# Patient Record
Sex: Male | Born: 1975
Health system: Southern US, Academic
[De-identification: ages and names within clinical notes are randomized; demographics above are authoritative.]

---

## 2015-10-14 ENCOUNTER — Emergency Department
Admission: EM | Admit: 2015-10-14 | Discharge: 2015-10-14 | Disposition: A | Discharge status: Home / Self Care | Payer: Self-pay | Attending: Student | Admitting: Student

## 2015-10-14 ENCOUNTER — Encounter: Payer: Self-pay | Admitting: Emergency Medicine

## 2015-10-14 ENCOUNTER — Emergency Department: Payer: Self-pay

## 2015-10-14 DIAGNOSIS — J40 Bronchitis, not specified as acute or chronic: Secondary | ICD-10-CM

## 2015-10-14 DIAGNOSIS — J209 Acute bronchitis, unspecified: Secondary | ICD-10-CM | POA: Insufficient documentation

## 2015-10-14 DIAGNOSIS — Z720 Tobacco use: Secondary | ICD-10-CM | POA: Insufficient documentation

## 2015-10-14 LAB — POCT RAPID STREP A: STREPTOCOCCUS, GROUP A SCREEN (DIRECT): NEGATIVE

## 2015-10-14 MED ORDER — AZITHROMYCIN 250 MG PO TABS
500.0000 mg | ORAL_TABLET | Freq: Once | ORAL | Status: AC
  Administered 2015-10-14: 500 mg via ORAL
  Filled 2015-10-14: qty 2

## 2015-10-14 MED ORDER — PREDNISONE 20 MG PO TABS
60.0000 mg | ORAL_TABLET | Freq: Every day | ORAL | Status: DC
  Administered 2015-10-14: 60 mg via ORAL
  Filled 2015-10-14: qty 3

## 2015-10-14 MED ORDER — BENZONATATE 100 MG PO CAPS
100.0000 mg | ORAL_CAPSULE | Freq: Once | ORAL | Status: AC
  Administered 2015-10-14: 100 mg via ORAL
  Filled 2015-10-14: qty 1

## 2015-10-14 MED ORDER — AZITHROMYCIN 250 MG PO TABS: ORAL_TABLET | ORAL | Status: AC

## 2015-10-14 MED ORDER — IBUPROFEN 600 MG PO TABS
600.0000 mg | ORAL_TABLET | Freq: Once | ORAL | Status: AC
  Administered 2015-10-14: 600 mg via ORAL
  Filled 2015-10-14: qty 1

## 2015-10-14 MED ORDER — IPRATROPIUM-ALBUTEROL 0.5-2.5 (3) MG/3ML IN SOLN
3.0000 mL | Freq: Once | RESPIRATORY_TRACT | Status: AC
  Administered 2015-10-14: 3 mL via RESPIRATORY_TRACT
  Filled 2015-10-14: qty 3

## 2015-10-14 NOTE — ED Notes (Signed)
Patient ambulatory to triage with steady gait, without difficulty or distress noted; pt reports prod cough green sputum since last week with no fever

## 2015-10-14 NOTE — ED Provider Notes (Signed)
-----------------------------------------   8:15 AM on 10/14/2015 -----------------------------------------  Care was assumed from Dr. Zenda AlpersWebster at 7:30 pending results of chest x-ray and strep test which are both negative. Tachycardia has resolved at this time. Patient with symptomatic improvement. We'll discharge with azithromycin and Dr. Leone PayorWebster's discharge paperwork. I discussed return precautions and need for close follow-up with the patient he is comfortable with the discharge plan.  Gayla DossEryka A Tasfia Vasseur, MD 10/14/15 [phone]

## 2015-10-14 NOTE — ED Provider Notes (Signed)
[facility] Emergency Department Provider Note  ____________________________________________  Time seen: Approximately 645 AM  I have reviewed the triage vital signs and the nursing notes.   HISTORY  Chief Complaint Cough    HPI Joshua Finley is a 39 y.o. male who comes into the hospital today complaining of bronchitis. The patient reports that he has had a cough since Thursday and every time he gets a cold he gets bronchitis. The patient reports that he is unable to sleep and he came in tonight because he coughed up some blood. The patient reports that he has some green and yellow mucus. He denies any fevers but does have some chest pain with coughing. The patient reports that the symptoms started with a sore throat and then turned into a cold. He reports that in the past at either been pneumonia or bronchitis. The patient has some mild shortness of breath and was sweaty yesterday but he denies any nausea vomiting or abdominal pain. The patient reports he was here just to see what was going on and see if he can have some antibiotics for his symptoms.The patient reports his pain as a 9 out of 10 in intensity.   Past medical history Bronchitis IBS There are no active problems to display for this patient.   History reviewed. No pertinent past surgical history.  No current outpatient prescriptions on file.  Allergies Review of patient's allergies indicates no known allergies.  No family history on file.  Social History Social History  Substance Use Topics  . Smoking status: Current Every Day Smoker -- 0.50 packs/day    Types: Cigarettes  . Smokeless tobacco: None  . Alcohol Use: No    Review of Systems Constitutional: No fever/chills Eyes: No visual changes. ENT:  sore throat. Cardiovascular:  chest pain with cough Respiratory:  shortness of breath and cough, hemoptysis Gastrointestinal: No abdominal pain.  No nausea, no vomiting.  No diarrhea.   No constipation. Genitourinary: Negative for dysuria. Musculoskeletal: Negative for back pain. Skin: Negative for rash. Neurological: Negative for headaches, focal weakness or numbness.  10-point ROS otherwise negative.  ____________________________________________   PHYSICAL EXAM:  VITAL SIGNS: ED Triage Vitals  Enc Vitals Group     BP 10/14/15 0637 139/67 mmHg     Pulse Rate 10/14/15 0637 110     Resp 10/14/15 0637 20     Temp 10/14/15 0637 98 F (36.7 C)     Temp Source 10/14/15 0637 Oral     SpO2 10/14/15 0637 100 %     Weight 10/14/15 0637 230 lb (104.327 kg)     Height 10/14/15 0637 6\' 1"  (1.854 m)     Head Cir --      Peak Flow --      Pain Score 10/14/15 0710 9     Pain Loc --      Pain Edu? --      Excl. in GC? --     Constitutional: Alert and oriented. Well appearing and in mild distress. Eyes: Conjunctivae are normal. PERRL. EOMI. Head: Atraumatic. Nose: No congestion/rhinnorhea. Mouth/Throat: Mucous membranes are moist.  Oropharynx erythematous, with no purulence or drainage. Cardiovascular: Normal rate, regular rhythm. Grossly normal heart sounds.  Good peripheral circulation. Respiratory: Normal respiratory effort.  No retractions. Breath sounds diminished throughout all lung fields with no significant wheezing Gastrointestinal: Soft and nontender. No distention. Positive bowel sounds Musculoskeletal: No lower extremity tenderness nor edema.   Neurologic:  Normal speech and language. No gross focal  neurologic deficits are appreciated. No gait instability. Skin:  Skin is warm, dry and intact.  Psychiatric: Mood and affect are normal.   ____________________________________________   LABS (all labs ordered are listed, but only abnormal results are displayed)  Labs Reviewed  CULTURE, GROUP A STREP ([facility] ONLY)  POCT RAPID STREP A    ____________________________________________  EKG  None ____________________________________________  RADIOLOGY  Chest x-ray ____________________________________________   PROCEDURES  Procedure(s) performed: None  Critical Care performed: No  ____________________________________________   INITIAL IMPRESSION / ASSESSMENT AND PLAN / ED COURSE  Pertinent labs & imaging results that were available during my care of the patient were reviewed by me and considered in my medical decision making (see chart for details).  This is a 39 year old male who comes in complaining of bronchitis. The patient does have a cough with greenish and yellow sputum. The patient has some mild tachycardia and some diminished breath sounds. I'll give the patient a dose of prednisone as well as a DuoNeb treatment. I will also give the patient ibuprofen and benzonatate for his cough. I will attempt to hydrate the patient orally to see if we can improve his tachycardia.  The patient's care will be signed out to Dr. Toney Rakes who will follow-up the results of the chest x-ray and reassess the patient. The patient's rapid strep is negative but I will give the patient a dose of azithromycin as well for his bronchitis. ____________________________________________   FINAL CLINICAL IMPRESSION(S) / ED DIAGNOSES  Final diagnoses:  Bronchitis      Rebecka Apley, MD 10/14/15 [phone]

## 2015-10-16 LAB — CULTURE, GROUP A STREP (THRC)

## 2016-03-02 ENCOUNTER — Encounter: Payer: Self-pay | Admitting: Emergency Medicine

## 2016-03-02 ENCOUNTER — Emergency Department
Admission: EM | Admit: 2016-03-02 | Discharge: 2016-03-02 | Disposition: A | Discharge status: Home / Self Care | Payer: Self-pay | Attending: Emergency Medicine | Admitting: Emergency Medicine

## 2016-03-02 DIAGNOSIS — F17210 Nicotine dependence, cigarettes, uncomplicated: Secondary | ICD-10-CM | POA: Insufficient documentation

## 2016-03-02 DIAGNOSIS — L600 Ingrowing nail: Secondary | ICD-10-CM | POA: Insufficient documentation

## 2016-03-02 MED ORDER — TRAMADOL HCL 50 MG PO TABS: 50.0000 mg | ORAL_TABLET | Freq: Four times a day (QID) | ORAL | Status: AC | PRN

## 2016-03-02 MED ORDER — CEPHALEXIN 500 MG PO CAPS: 500.0000 mg | ORAL_CAPSULE | Freq: Four times a day (QID) | ORAL | Status: AC

## 2016-03-02 NOTE — ED Provider Notes (Signed)
[facility] Emergency Department Provider Note  ____________________________________________  Time seen: Approximately 11:52 AM  I have reviewed the triage vital signs and the nursing notes.   HISTORY  Chief Complaint Toe Pain   HPI Joshua Finley is a 40 y.o. male here with complaint of infection in his left great toe. Patient states he has had problems with his toenail and has actually removed his toe nail himself but it always comes back that.Last week he noticed that his right great toe was getting red and tender and he also noticed some pus coming out from underneath his toenail. He denies any fever or chills and denies any history of diabetes. He rates his pain 5 out of 10.   History reviewed. No pertinent past medical history.  There are no active problems to display for this patient.   No past surgical history on file.  Current Outpatient Rx  Name  Route  Sig  Dispense  Refill  . cephALEXin (KEFLEX) 500 MG capsule   Oral   Take 1 capsule (500 mg total) by mouth 4 (four) times daily.   28 capsule   0   . traMADol (ULTRAM) 50 MG tablet   Oral   Take 1 tablet (50 mg total) by mouth every 6 (six) hours as needed.   20 tablet   0     Allergies Review of patient's allergies indicates no known allergies.  No family history on file.  Social History Social History  Substance Use Topics  . Smoking status: Current Every Day Smoker -- 0.50 packs/day    Types: Cigarettes  . Smokeless tobacco: None  . Alcohol Use: No    Review of Systems Constitutional: No fever/chills Cardiovascular: Denies chest pain. Respiratory: Denies shortness of breath. Gastrointestinal:   No nausea, no vomiting.  Musculoskeletal: Positive for right great toe pain Skin: Erythema right great toe.   10-point ROS otherwise negative.  ____________________________________________   PHYSICAL EXAM:  VITAL SIGNS: ED Triage Vitals  Enc Vitals Group     BP  03/02/16 0947 113/73 mmHg     Pulse Rate 03/02/16 0947 111     Resp 03/02/16 0947 14     Temp 03/02/16 0947 98 F (36.7 C)     Temp Source 03/02/16 0947 Oral     SpO2 03/02/16 0947 98 %     Weight 03/02/16 0947 200 lb (90.719 kg)     Height 03/02/16 0947 6\' 1"  (1.854 m)     Head Cir --      Peak Flow --      Pain Score 03/02/16 0948 5     Pain Loc --      Pain Edu? --      Excl. in GC? --     Constitutional: Alert and oriented. Well appearing and in no acute distress. Eyes: Conjunctivae are normal. PERRL. EOMI. Head: Atraumatic. Nose: No congestion/rhinnorhea. Neck: No stridor. Cardiovascular: Normal rate, regular rhythm. Grossly normal heart sounds.  Good peripheral circulation. Respiratory: Normal respiratory effort.  No retractions. Lungs CTAB. Musculoskeletal: Moves upper and lower extremities without any difficulty. There is moderate tenderness on palpation of the right great toe with large thick fungal nail present. There is no obvious purulent drainage at this time. Neurologic:  Normal speech and language. No gross focal neurologic deficits are appreciated. No gait instability.  Skin:  Skin is warm, dry and intact. Erythematous right great toe as described above. Fungal nail also appears to be ingrown on the medial  aspect. Psychiatric: Mood and affect are normal. Speech and behavior are normal.  ____________________________________________   LABS (all labs ordered are listed, but only abnormal results are displayed)  Labs Reviewed - No data to display  PROCEDURES  Procedure(s) performed: None  Critical Care performed: No  ____________________________________________   INITIAL IMPRESSION / ASSESSMENT AND PLAN / ED COURSE  Pertinent labs & imaging results that were available during my care of the patient were reviewed by me and considered in my medical decision making (see chart for details).  This was started on Keflex 500 mg 4 times a day for 7 days along with  tramadol if needed for pain. He is to soak his foot twice a day making sure that is completely dry before putting on socks and shoes. He is to follow-up with Dr. Alberteen Spindle in podiatry over at [facility]. He is aware that his fungal nails should be treated as this will continue to come back regardless of whether he is taking his nails completely off or not. ____________________________________________   FINAL CLINICAL IMPRESSION(S) / ED DIAGNOSES  Final diagnoses:  Ingrown right big toenail      Tommi Rumps, PA-C 03/02/16 1316  Sharyn Creamer, MD 03/02/16 1500

## 2016-03-02 NOTE — Discharge Instructions (Signed)
Ingrown Toenail An ingrown toenail occurs when the corner or sides of your toenail grow into the surrounding skin. The big toe is most commonly affected, but it can happen to any of your toes. If your ingrown toenail is not treated, you will be at risk for infection. CAUSES This condition may be caused by:  Wearing shoes that are too small or tight.  Injury or trauma, such as stubbing your toe or having your toe stepped on.  Improper cutting or care of your toenails.  Being born with (congenital) nail or foot abnormalities, such as having a nail that is too big for your toe. RISK FACTORS Risk factors for an ingrown toenail include:  Age. Your nails tend to thicken as you get older, so ingrown nails are more common in older people.  Diabetes.  Cutting your toenails incorrectly.  Blood circulation problems. SYMPTOMS Symptoms may include:  Pain, soreness, or tenderness.  Redness.  Swelling.  Hardening of the skin surrounding the toe. Your ingrown toenail may be infected if there is fluid, pus, or drainage. DIAGNOSIS  An ingrown toenail may be diagnosed by medical history and physical exam. If your toenail is infected, your health care provider may test a sample of the drainage. TREATMENT Treatment depends on the severity of your ingrown toenail. Some ingrown toenails may be treated at home. More severe or infected ingrown toenails may require surgery to remove all or part of the nail. Infected ingrown toenails may also be treated with antibiotic medicines. HOME CARE INSTRUCTIONS  If you were prescribed an antibiotic medicine, finish all of it even if you start to feel better.  Soak your foot in warm soapy water for 20 minutes, 3 times per day or as directed by your health care provider.  Carefully lift the edge of the nail away from the sore skin by wedging a small piece of cotton under the corner of the nail. This may help with the pain. Be careful not to cause more injury  to the area.  Wear shoes that fit well. If your ingrown toenail is causing you pain, try wearing sandals, if possible.  Trim your toenails regularly and carefully. Do not cut them in a curved shape. Cut your toenails straight across. This prevents injury to the skin at the corners of the toenail.  Keep your feet clean and dry.  If you are having trouble walking and are given crutches by your health care provider, use them as directed.  Do not pick at your toenail or try to remove it yourself.  Take medicines only as directed by your health care provider.  Keep all follow-up visits as directed by your health care provider. This is important. SEEK MEDICAL CARE IF:  Your symptoms do not improve with treatment. SEEK IMMEDIATE MEDICAL CARE IF:    You have red streaks that start at your foot and go up your leg.  You have a fever.  You have increased redness, swelling, or pain.  You have fluid, blood, or pus coming from your toenail.   This information is not intended to replace advice given to you by your health care provider. Make sure you discuss any questions you have with your health care provider.   Document Released: 12/03/2000 Document Revised: 04/22/2015 Document Reviewed: 10/30/2014 Elsevier Interactive Patient Education 2016 [employer]   You need to make an appointment with Dr. [facility]indleline at [facility] for further treatment of your fungal toe infection. Begin taking antibiotics and also soak your foot  twice a day if possible in warm soapy water. Make sure that this has gotten completely dry before wearing shoes or socks. Tramadol is for pain if needed. Do not drive or operate machinery while taking this medication.

## 2016-03-02 NOTE — ED Notes (Signed)
Pt in via triage w/ complaints of increasing pain, swelling, to right great toe over the last week.  Pt reports odorous puss coming from toe nail bed this week.  Toe nail thick in comparison to left great toe, redness noted to right great toe.  Pt ambulatory to room.

## 2016-03-02 NOTE — ED Notes (Signed)
Says ingrown toe nail is infected.

## 2017-05-19 IMAGING — CR DG CHEST 2V
1 series · 2 of 2 positions shown · non-contrast
Comparison: None.

CLINICAL DATA: One week history of persistent cough

EXAM:
CHEST  2 VIEW

[Series 1: dg chest 2 view · 0.14mm/px · 2 of 2 slices shown]
[im 1/2]
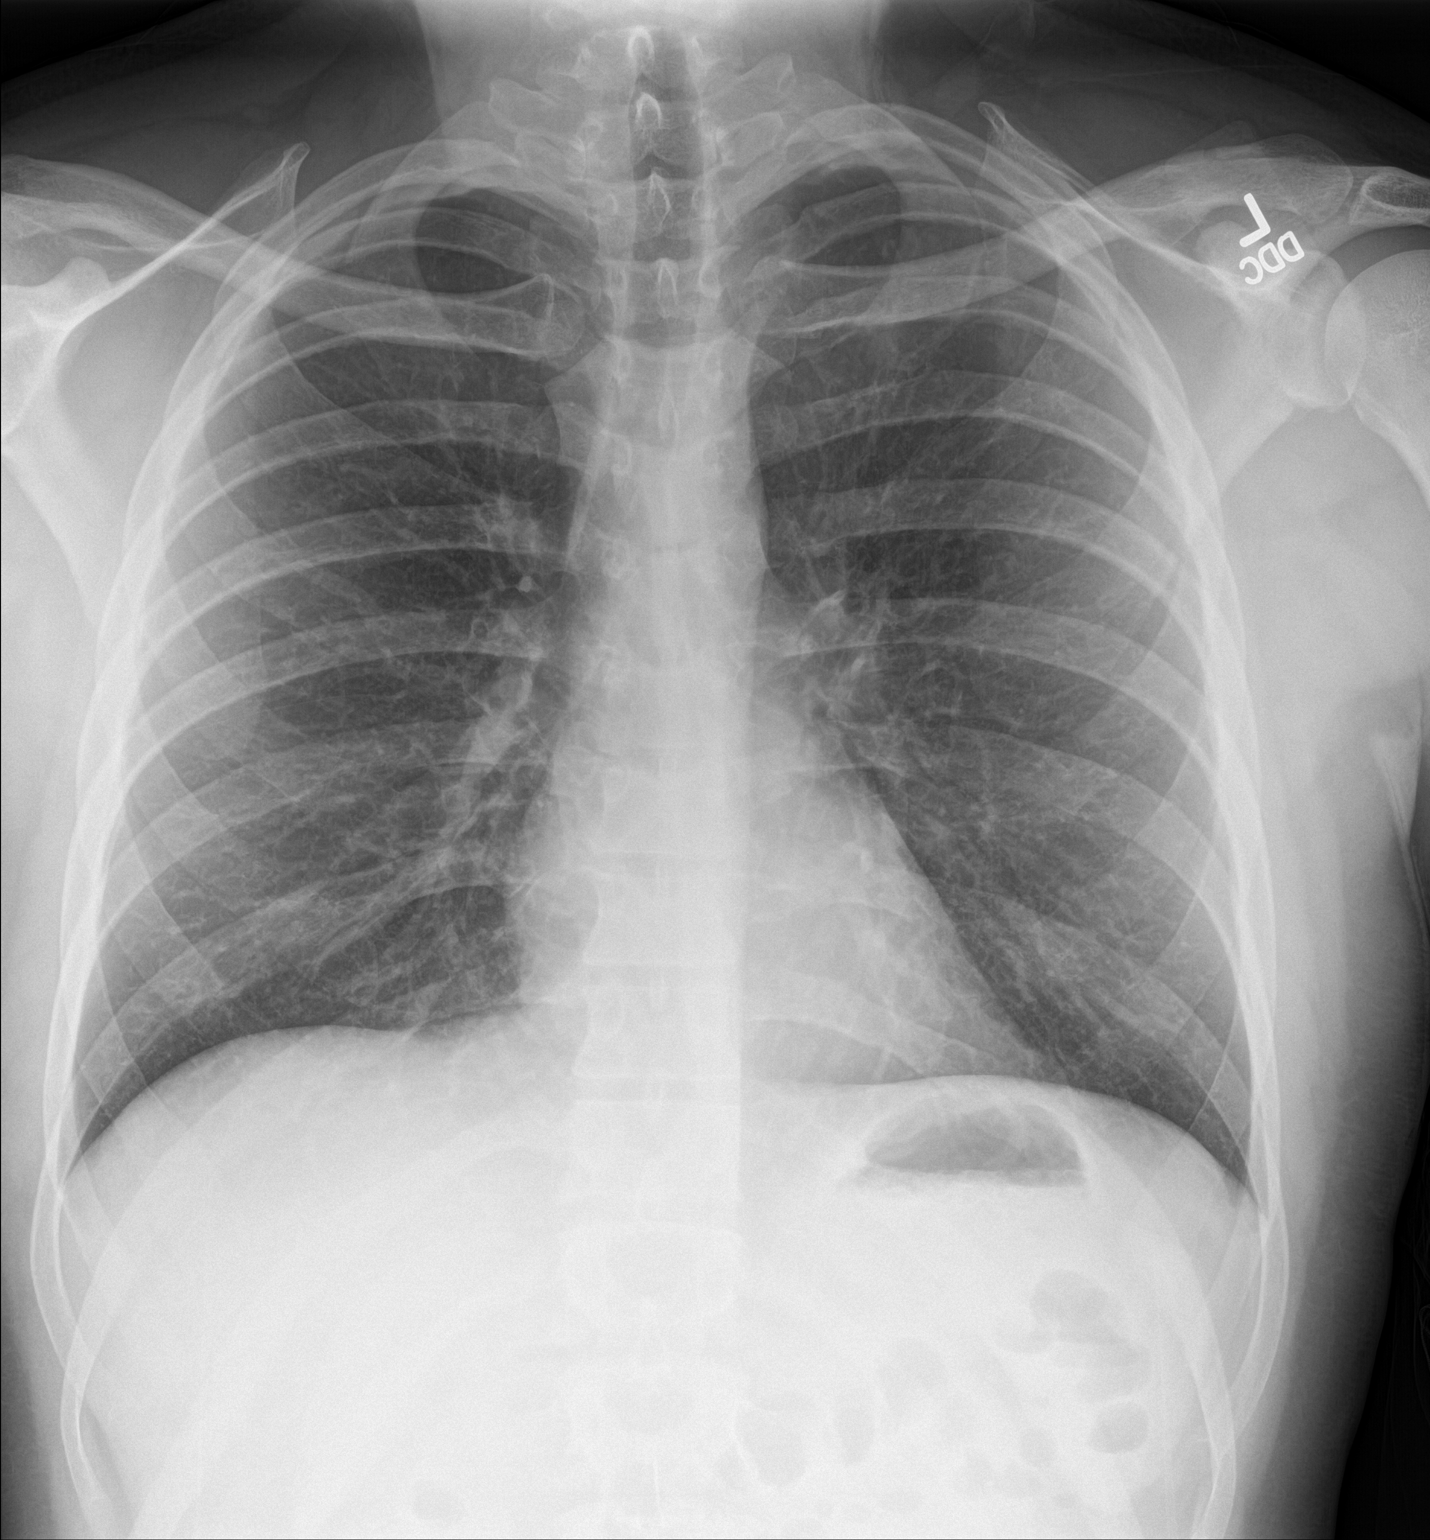
[im 2/2]
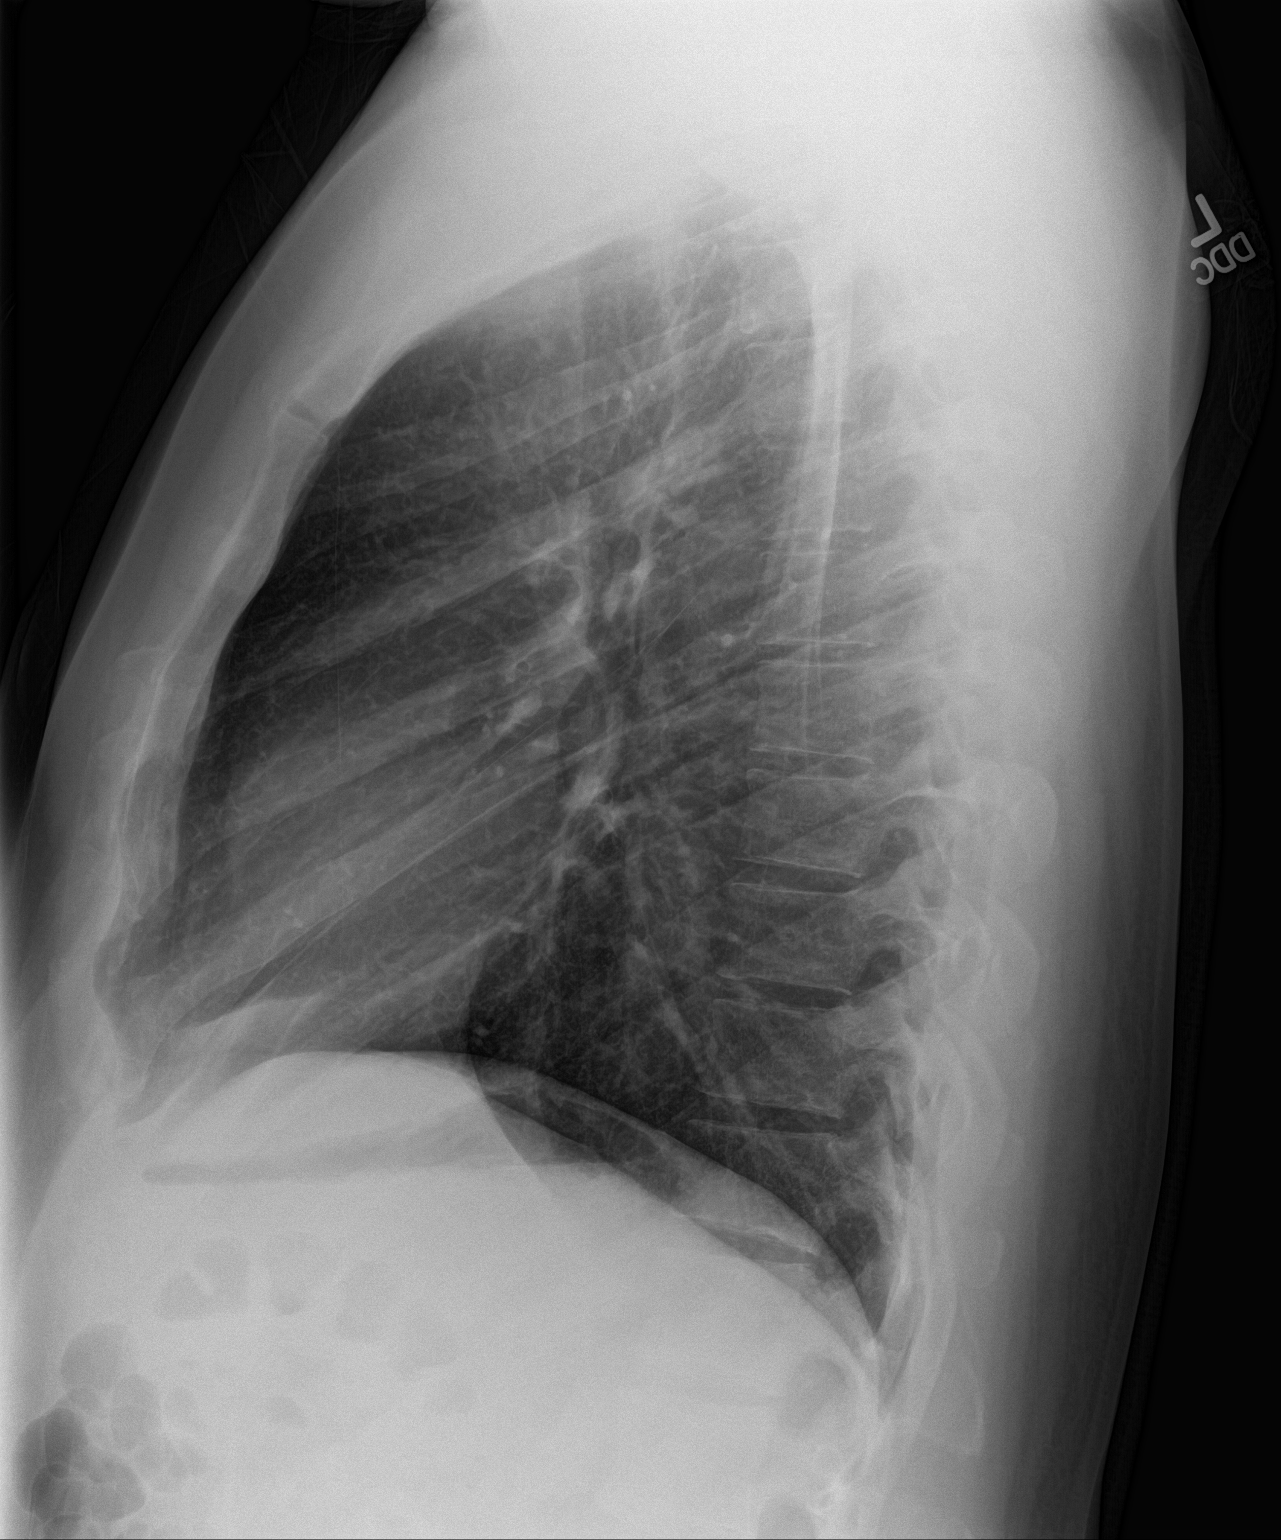

[2 of 2 positions shown; findings below may reference images not displayed]

FINDINGS: Lungs are clear. Heart size and pulmonary vascularity are normal. No
adenopathy. No bone lesions.
IMPRESSION: No edema or consolidation.

## 2022-06-12 ENCOUNTER — Emergency Department
Admission: EM | Admit: 2022-06-12 | Discharge: 2022-06-13 | Disposition: A | Discharge status: Home / Self Care | Payer: 59 | Attending: Emergency Medicine | Admitting: Emergency Medicine

## 2022-06-12 ENCOUNTER — Other Ambulatory Visit: Payer: Self-pay

## 2022-06-12 ENCOUNTER — Emergency Department (HOSPITAL_COMMUNITY): Payer: 59

## 2022-06-12 DIAGNOSIS — R4182 Altered mental status, unspecified: Secondary | ICD-10-CM | POA: Insufficient documentation

## 2022-06-12 DIAGNOSIS — F19959 Other psychoactive substance use, unspecified with psychoactive substance-induced psychotic disorder, unspecified: Secondary | ICD-10-CM

## 2022-06-12 LAB — CBC WITH DIFF
BASOPHIL #: <0.1 10*3/uL
BASOPHIL %: 0 %
EOSINOPHIL #: <0.1 10*3/uL
EOSINOPHIL %: 0 %
HCT: 37.1 %
HGB: 12.7 g/dL
IMMATURE GRANULOCYTE #: <0.1 10*3/uL
IMMATURE GRANULOCYTE %: 0 %
LYMPHOCYTE #: 1.4 10*3/uL
LYMPHOCYTE %: 12 %
MCH: 31.8 pg
MCHC: 34.2 g/dL
MCV: 93 fL
MONOCYTE #: 0.74 10*3/uL
MONOCYTE %: 6 %
MPV: 10 fL
NEUTROPHIL #: 9.87 10*3/uL
NEUTROPHIL %: 82 %
PLATELETS: 320 10*3/uL
RBC: 3.99 10*6/uL
RDW-CV: 12.4 %
WBC: 12.1 10*3/uL

## 2022-06-12 LAB — BASIC METABOLIC PANEL
ANION GAP: 14 mmol/L — ABNORMAL HIGH (ref 4–13)
BUN/CREA RATIO: 9 (ref 6–22)
BUN: 10 mg/dL (ref 8–25)
CALCIUM: 9.4 mg/dL (ref 8.5–10.0)
CHLORIDE: 106 mmol/L (ref 96–111)
CO2 TOTAL: 20 mmol/L — ABNORMAL LOW (ref 22–30)
CREATININE: 1.16 mg/dL (ref 0.75–1.35)
ESTIMATED GFR: 80 mL/min/BSA (ref >=60)
GLUCOSE: 74 mg/dL (ref 65–125)
POTASSIUM: 3.4 mmol/L — ABNORMAL LOW (ref 3.5–5.1)
SODIUM: 140 mmol/L (ref 136–145)

## 2022-06-12 LAB — DRUG SCREEN, WITH CONFIRMATION, URINE
AMPHETAMINES, URINE: NEGATIVE
BARBITURATES URINE: NEGATIVE
BENZODIAZEPINES URINE: NEGATIVE
BUPRENORPHINE URINE: POSITIVE — AB
CANNABINOIDS URINE: NEGATIVE
COCAINE METABOLITES URINE: NEGATIVE
CREATININE RANDOM URINE: 43 mg/dL — ABNORMAL LOW (ref 50–100)
ECSTASY/MDMA URINE: NEGATIVE
FENTANYL, RANDOM URINE: NEGATIVE
METHADONE URINE: NEGATIVE
OPIATES URINE (LOW CUTOFF): NEGATIVE
OXYCODONE URINE: NEGATIVE

## 2022-06-12 LAB — URINALYSIS, MICROSCOPIC
RBCS: 0 /hpf (ref <6.0)
WBCS: 1 /hpf (ref <4.0)

## 2022-06-12 LAB — BLOOD GAS W/ CO-OX, LYTES, LACTATE REFLEX
%FIO2 (VENOUS): 21 %
BASE DEFICIT: 3.7 mmol/L — ABNORMAL HIGH (ref <=3.0)
BICARBONATE (VENOUS): 20.5 mmol/L — ABNORMAL LOW (ref 22.0–26.0)
CARBOXYHEMOGLOBIN: 1.3 % (ref 0.0–2.5)
CHLORIDE: 104 mmol/L (ref 101–111)
GLUCOSE: 80 mg/dL (ref 60–105)
HEMOGLOBIN: 13.2 g/dL (ref 12.0–18.0)
IONIZED CALCIUM: 1.25 mmol/L (ref 1.10–1.35)
LACTATE: 1.8 mmol/L — ABNORMAL HIGH (ref 0.0–1.3)
MET-HEMOGLOBIN: 1.6 % (ref 0.0–2.0)
O2 SATURATION (VENOUS): 47.2 %
O2CT: 7.4 % (ref 6.7–17.5)
OXYHEMOGLOBIN: 39.7 % — ABNORMAL LOW (ref 40.0–80.0)
PCO2 (VENOUS): 42 mm/Hg (ref 41–51)
PH (VENOUS): 7.33 (ref 7.31–7.41)
PO2 (VENOUS): 28 mm/Hg — ABNORMAL LOW (ref 35–50)
SODIUM: 137 mmol/L (ref 137–145)
WHOLE BLOOD POTASSIUM: 3.4 mmol/L — ABNORMAL LOW (ref 3.5–4.6)

## 2022-06-12 LAB — THYROID STIMULATING HORMONE WITH FREE T4 REFLEX: TSH: 2.031 u[IU]/mL (ref 0.430–3.550)

## 2022-06-12 LAB — URINALYSIS, MACROSCOPIC
BILIRUBIN: NEGATIVE mg/dL
BLOOD: NEGATIVE mg/dL
COLOR: NORMAL
GLUCOSE: NEGATIVE mg/dL
KETONES: 20 mg/dL — AB
LEUKOCYTES: NEGATIVE WBCs/uL
NITRITE: NEGATIVE
PH: 5.5 (ref 5.0–8.0)
PROTEIN: NEGATIVE mg/dL
SPECIFIC GRAVITY: 1.006 (ref 1.005–1.030)
UROBILINOGEN: NEGATIVE mg/dL

## 2022-06-12 LAB — POC BLOOD GLUCOSE (RESULTS): GLUCOSE, POC: 76 mg/dl (ref 70–105)

## 2022-06-12 LAB — ETHANOL, SERUM/PLASMA
ETHANOL: <10 mg/dL (ref <10)
ETHANOL: NOT DETECTED

## 2022-06-12 MED ORDER — LACTATED RINGERS IV BOLUS
1000.0000 mL | INJECTION | Status: AC
Start: 2022-06-12 — End: 2022-06-13
  Administered 2022-06-12: 1000 mL via INTRAVENOUS
  Administered 2022-06-13: 0 mL via INTRAVENOUS

## 2022-06-12 NOTE — ED Nurses Note (Signed)
Observed patient. adequate chest rise and fall observed. patient resting on ED stretcher locked in lowest position with side rails raised. corrections officers at bedside.

## 2022-06-12 NOTE — ED Nurses Note (Signed)
Pt sat up and given a cup of water per MD's request. Pt is drinking water at this time and tolerating well. He denies any complaints at this time.

## 2022-06-12 NOTE — ED Nurses Note (Signed)
LR started. Pt is resting in bed with security at bedside.

## 2022-06-12 NOTE — ED Resident Handoff Note (Signed)
Emergency Department  Resident Course Note    Patient Name: Sean Rich  Age and Gender: 46 y.o. male  Date of Birth: 01-23-1976  PCP: Kassie Mends  Attending: Truddie Coco, MD    After a thorough discussion of the patient including presentation, ED course, and review of above information I have assumed care of Sean Rich from Dr. Marlin Rich at 22:35 06/12/2022    Triage Summary:   Altered Mental Status (Pt possibly exposed to unknown substance @ [facility]. Per guards @ bedside there have been recent incidences of paper being sprayed with unknown substances and sent through the mail which the prisoners then roll up and "smoke".)      HPI:  In brief, patient is a 46 y.o. Unknown male presenting with    [facility] inmate, altered in his cell. Providers state that he was given narcan 4mg  intranasal, 2mg  IM. Sent for evaluation. Concern for substances obtained through mail. Garbled speech, only 1-2 words at a time. Speaking a little better now, but completely disoriented.     Pending Studies:  ***    Plan:  Metabolize until sober.     Course:  After assuming care of , ED course included the following:      ***    Clinical Impression:     Clinical Impression   None    ***    Disposition: Data Unavailable    Patient will be admitted to *** service for further evaluation and management.      After discussing patient's HPI, ED course and plan, patient was signed out and care transferred to Dr.  , at *** on 06/12/2022***. Please see their Resident Course Note for further patient care information.    Following the above history, physical exam, and studies, the patient was deemed stable and suitable for discharge and he will follow up their appropriate medical providers as below.  Medication instructions were discussed with the patient/patient's family. Patient/patient's family was advised to return to the ED with any new, concerning or worsening symptoms and  follow up as directed. The patient/patient's family verbalized understanding of all instructions and had no further questions or concerns.    Follow up:   No follow-up provider specified.    Allergies: Not on File    Pertinent Imaging/Lab results:  Labs Ordered/Reviewed   DRUG SCREEN, WITH CONFIRMATION, URINE - Abnormal; Notable for the following components:       Result Value    BUPRENORPHINE URINE Positive (*)     CREATININE RANDOM URINE 43 (*)     All other components within normal limits   URINALYSIS, MACROSCOPIC - Abnormal; Notable for the following components:    KETONES 20 (*)     All other components within normal limits   BASIC METABOLIC PANEL - Abnormal; Notable for the following components:    POTASSIUM 3.4 (*)     CO2 TOTAL 20 (*)     ANION GAP 14 (*)     All other components within normal limits   BLOOD GAS W/ CO-OX, LYTES, LACTATE REFLEX - Abnormal; Notable for the following components:    PO2 (VENOUS) 28 (*)     BICARBONATE (VENOUS) 20.5 (*)     BASE DEFICIT 3.7 (*)     OXYHEMOGLOBIN 39.7 (*)     WHOLE BLOOD POTASSIUM 3.4 (*)     LACTATE 1.8 (*)     All other components within normal limits   URINALYSIS, MICROSCOPIC - Normal  THYROID STIMULATING HORMONE WITH FREE T4 REFLEX - Normal   POC BLOOD GLUCOSE (RESULTS) - Normal   URINALYSIS, MACROSCOPIC AND MICROSCOPIC W/CULTURE REFLEX    Narrative:     The following orders were created for panel order URINALYSIS, MACROSCOPIC AND MICROSCOPIC W/CULTURE REFLEX.  Procedure                               Abnormality         Status                     ---------                               -----------         ------                     URINALYSIS, MACROSCOPIC[[id number]]      Abnormal            Final result               URINALYSIS, MICROSCOPIC[[id number]]      Normal              Final result                 Please view results for these tests on the individual orders.   CBC/DIFF    Narrative:     The following orders were created for panel order  CBC/DIFF.  Procedure                               Abnormality         Status                     ---------                               -----------         ------                     CBC WITH ELYH[[id number]]                                    Final result                 Please view results for these tests on the individual orders.   CBC WITH DIFF   ETHANOL, SERUM   SUBOXONE CONFIRMATORY/DEFINITIVE, URINE, BY LC-MS/MS (PERFORMABLE)          Margaretmary Bayley, MD 06/12/2022, 22:35   PGY-3 Emergency Medicine  [facility]      *Parts of this patients chart were completed in a retrospective fashion due to simultaneous direct patient care activities in the Emergency Department.   *This note was partially generated using MModal Fluency Direct system, and there may be some incorrect words, spellings, and punctuation that were not noted in checking the note before saving.

## 2022-06-12 NOTE — ED Provider Notes (Signed)
[facility] - Emergency Department  Provider Note    Name: Sean Rich  Age and Gender: 46 y.o. male  Date of Birth: 1976-08-25  Date of Service: 06/12/2022   MRN: [id number]  PCP: [facility]    Chief Complaint   Patient presents with   . Altered Mental Status     Pt possibly exposed to unknown substance @ [facility]. Per guards @ bedside there have been recent incidences of paper being sprayed with unknown substances and sent through the mail which the prisoners then roll up and "smoke".       HPI:  Arrival: The patient arrived by ambulance and is accompanied by law enforcement  History Limitations: mental status and intoxication    Sean Rich is a 46 y.o. male presenting with altered mental status. Per report the patient was believed to have been exposed to an unknown substance at prison where he was believed to have smoke bug-spray at the prison. Law enforcement reports the patient received 4mg  Narcan intranasally. Per report papers the patient additionally received 2mg  of Narcan intramuscularly. Further Hx is limited. Patient states he does not take insulin. He states he is unsure if he received all of his daily medications. Patient denies any pain outside of where his IV was. No other Sx were reported at this time.       Below pertinent information reviewed with patient and/or EMR:  No past medical history on file.  Medications Prior to Admission     None        Not on File  No past surgical history on file.  Family Medical History:    None            Objective:  ED Triage Vitals [06/12/22 1812]   BP (Non-Invasive) (!) 137/91   Heart Rate 95   Respiratory Rate 18   Temp    SpO2 98 %   Weight    Height        Physical Exam  Vitals and nursing note reviewed.   Constitutional:       General: [name] is not in acute distress.     Appearance: Normal appearance.   HENT:      Head: Normocephalic and atraumatic.      Nose: Nose normal.   Eyes:      Extraocular  Movements: Extraocular movements intact.      Conjunctiva/sclera: Conjunctivae normal.   Cardiovascular:      Rate and Rhythm: Normal rate and regular rhythm.      Heart sounds: Normal heart sounds. No murmur heard.  Pulmonary:      Effort: Pulmonary effort is normal. No respiratory distress.      Breath sounds: Normal breath sounds. No wheezing.   Abdominal:      General: Bowel sounds are normal.      Palpations: Abdomen is soft.      Tenderness: There is no abdominal tenderness.      Comments: Delayed Diffuse abdominal tenderness   Musculoskeletal:         General: Normal range of motion.      Cervical back: Normal range of motion.   Skin:     General: Skin is warm and dry.      Capillary Refill: Capillary refill takes less than 2 seconds.   Neurological:      General: No focal deficit present.      Mental Status: [name] is alert.   Psychiatric:  Comments: Garbled nonsensical speech. Responds inappropriately to questions.          Labs:   Labs Ordered/Reviewed   DRUG SCREEN, WITH CONFIRMATION, URINE - Abnormal; Notable for the following components:       Result Value    BUPRENORPHINE URINE Positive (*)     CREATININE RANDOM URINE 43 (*)     All other components within normal limits   URINALYSIS, MACROSCOPIC - Abnormal; Notable for the following components:    KETONES 20 (*)     All other components within normal limits   BASIC METABOLIC PANEL - Abnormal; Notable for the following components:    POTASSIUM 3.4 (*)     CO2 TOTAL 20 (*)     ANION GAP 14 (*)     All other components within normal limits   BLOOD GAS W/ CO-OX, LYTES, LACTATE REFLEX - Abnormal; Notable for the following components:    PO2 (VENOUS) 28 (*)     BICARBONATE (VENOUS) 20.5 (*)     BASE DEFICIT 3.7 (*)     OXYHEMOGLOBIN 39.7 (*)     WHOLE BLOOD POTASSIUM 3.4 (*)     LACTATE 1.8 (*)     All other components within normal limits   URINALYSIS, MICROSCOPIC - Normal   THYROID STIMULATING HORMONE WITH FREE T4 REFLEX - Normal    POC BLOOD GLUCOSE (RESULTS) - Normal   URINALYSIS, MACROSCOPIC AND MICROSCOPIC W/CULTURE REFLEX    Narrative:     The following orders were created for panel order URINALYSIS, MACROSCOPIC AND MICROSCOPIC W/CULTURE REFLEX.  Procedure                               Abnormality         Status                     ---------                               -----------         ------                     URINALYSIS, MACROSCOPIC[[id number]]      Abnormal            Final result               URINALYSIS, MICROSCOPIC[[id number]]      Normal              Final result                 Please view results for these tests on the individual orders.   CBC/DIFF    Narrative:     The following orders were created for panel order CBC/DIFF.  Procedure                               Abnormality         Status                     ---------                               -----------         ------  CBC WITH BHAL[[id number]]                                    Final result                 Please view results for these tests on the individual orders.   CBC WITH DIFF   ETHANOL, SERUM   SUBOXONE CONFIRMATORY/DEFINITIVE, URINE, BY LC-MS/MS (PERFORMABLE)       Imaging:  CT BRAIN WO IV CONTRAST   Final Result by Edi, Radresults In (06/25 0549)   No acute intracranial process.             MDM/Course:  [name] is a 46 y.o. male presenting for evaluation of altered mental status.  Lab work and imaging ordered for further evaluation.  No acute findings at time of sign-out.  During emergency department stay patient has been progressively more coherent and now answering full sentences, however still disoriented responses.  Pending sobriety at time of sign-out.    Care was checked out to Dr. Para March following a discussion of the patient's course. Please refer to their Resident Course Note for further details of the patient's ED course.      ED Course as of 06/15/22 1311   Sat Jun 12, 2022   1851 GLUCOSE, POC: 76   1851 WBC:  12.1   1851 HGB: 12.7   1851 BLOOD GAS W/ CO-OX, LYTES, LACTATE REFLEX(!)   1851 KETONES(!): 20   1915 ETHANOL, SERUM: <10     MDM    Clinical Impression:     Clinical Impression   Altered mental status, unspecified altered mental status type (Primary)   Psychoactive substance-induced psychosis (CMS HCC)       Medications given:  Medications Administered in the ED   LR bolus infusion 1,000 mL (0 mL Intravenous Stopped 06/13/22 0522)       Disposition: Pending       Current Discharge Medication List      You have not been prescribed any medications.         Follow up:    [facility] - Emergency Department  [address]  [place] [address]  [phone]    If symptoms worsen      Parts of this patients chart were completed in a retrospective fashion due to simultaneous direct patient care activities in the Emergency Department.   This note was partially generated using MModal Fluency Direct system, and there may be some incorrect words, spellings, and punctuation that were not noted in checking the note before saving.      I am scribing for, and in the presence of, Fuller Mandril, MD, for services provided on 06/12/2022.   Jill Alexanders Florence Canner,  06/12/2022 6:24 PM    I personally performed the services described in this documentation, as scribed  in my presence, and it is both accurate  and complete.    Fuller Mandril, MD  Fuller Mandril, MD

## 2022-06-12 NOTE — ED Attending Note (Signed)
Emergency Medicine  Attending Only Note      Patient seen and examined with the Resident physician.  I agree with their plan of care and documentation in separate ED resident note; summation of the visit as below.    Name: Sean Rich  Age and Gender: 46 y.o. male  Date of Birth: [birth year]  MRN: [id number]    Brief HOPI:   Incarcerated, found unresponsive in his cell. Given multiple doses of narcan without improvement.  Per guards, prisoners have been smoking paper that has been mailed to them and embedded with unknown substances.  Patient now awake on arrival but mostly unintelligible speech.    Filed Vitals:    06/13/22 0230 06/13/22 0330 06/13/22 0522 06/13/22 0652   BP: 110/75 112/77 117/89 131/88   Pulse: 77 78 79 83   Resp: 18 16 17 18    SpO2: 99% 98% 97% 100%     Plan Summary:  Has had slow but continued progression/improvement of mental status however still altered.  Handed off to oncoming providers awaiting patient to metabolize unknown substance until at baseline.    Impression:   Clinical Impression   Altered mental status, unspecified altered mental status type (Primary)   Psychoactive substance-induced psychosis (CMS HCC)         Disposition: Discharged      Portions of this note may have been dictated using voice recognition software.     , MD  [facility] Department of Emergency Medicine

## 2022-06-12 NOTE — ED Attending Handoff Note (Signed)
Care assumed from Dr. Marcha Dutton,    Patient is an 46 y.o. male here for AMS. Only responsive to sternal rub. Got 6 of narcan, no improvement. Is incarcerated at [facility]. Here had garbled speech. Has improved to full sentences, but still incoherent.     Signed out pending clinical sobriety

## 2022-06-13 ENCOUNTER — Emergency Department (EMERGENCY_DEPARTMENT_HOSPITAL): Payer: 59

## 2022-06-13 DIAGNOSIS — R410 Disorientation, unspecified: Secondary | ICD-10-CM

## 2022-06-13 NOTE — ED Nurses Note (Signed)
Pt left unit ambulatory with discharge instructions. Pt verbalized understanding of instructions given. PIV removed with catheter intact. Pressure dressing applied. No s/s distress noted at time of discharge.

## 2022-06-13 NOTE — ED Attending Handoff Note (Signed)
Altered at [facility]. Somnolent there. Got narcan with no improvement. urine drug screen with Suboxone, prescribed. Getting better but not at baseline. Likely DC around 6 am.   He had a head CT which was negative. He said he had taken something to help him sleep.   CT BRAIN WO IV CONTRAST   Preliminary Result   No acute intracranial process.              Will DC back to prison under care of prison personnel

## 2022-06-13 NOTE — ED Nurses Note (Signed)
Pt alert and transported to CT by ED RN. Pt brought back to the room and states that he does not recall what occurred last night. He reports last night he stated to his friend that he has not slept for 3 days, therefore he gave him "ice" to smoke and that is the last thing he recalls. He denies any complaints at this time. Pt sitting up at this time and given warm blankets and a box lunch.

## 2022-06-13 NOTE — Discharge Instructions (Signed)
You were evaluated in the emergency department today for altered mental status.  This is likely due to the drugs that you used prior to arrival.  We recommend not doing drugs in the future.

## 2022-06-17 LAB — SUBOXONE CONFIRMATORY/DEFINITIVE, URINE, BY LC-MS/MS (PERFORMABLE)
BUPRENORPHINE: 76 ng/mL — ABNORMAL HIGH (ref <10)
NALOXONE: 689 ng/mL — ABNORMAL HIGH (ref <5)
NORBUPRENORPHINE: 180 ng/mL — ABNORMAL HIGH (ref <10)
